# Patient Record
Sex: Male | Born: 1970 | Race: White | Hispanic: No | Marital: Married | Smoking: Former smoker
Health system: Southern US, Community
[De-identification: ages and names within clinical notes are randomized; demographics above are authoritative.]

---

## 2014-07-24 ENCOUNTER — Ambulatory Visit: Payer: Self-pay | Admitting: Otolaryngology

## 2016-07-31 IMAGING — CR DG CHEST 2V
1 series · 2 of 2 positions shown · non-contrast
Comparison: None.

CLINICAL DATA: Cough. Initial encounter.Patient had the flu 2
months ago and developed a cough which has never gone away and
worsened in past several days. Patient has no previous HX of any
heart or lung disease but has previously been a smoker.

EXAM:
CHEST  2 VIEW

[Series 1: dxr chest pa (or ap) and lateral · 0.14mm/px · 2 of 2 slices shown]
[im 1/2]
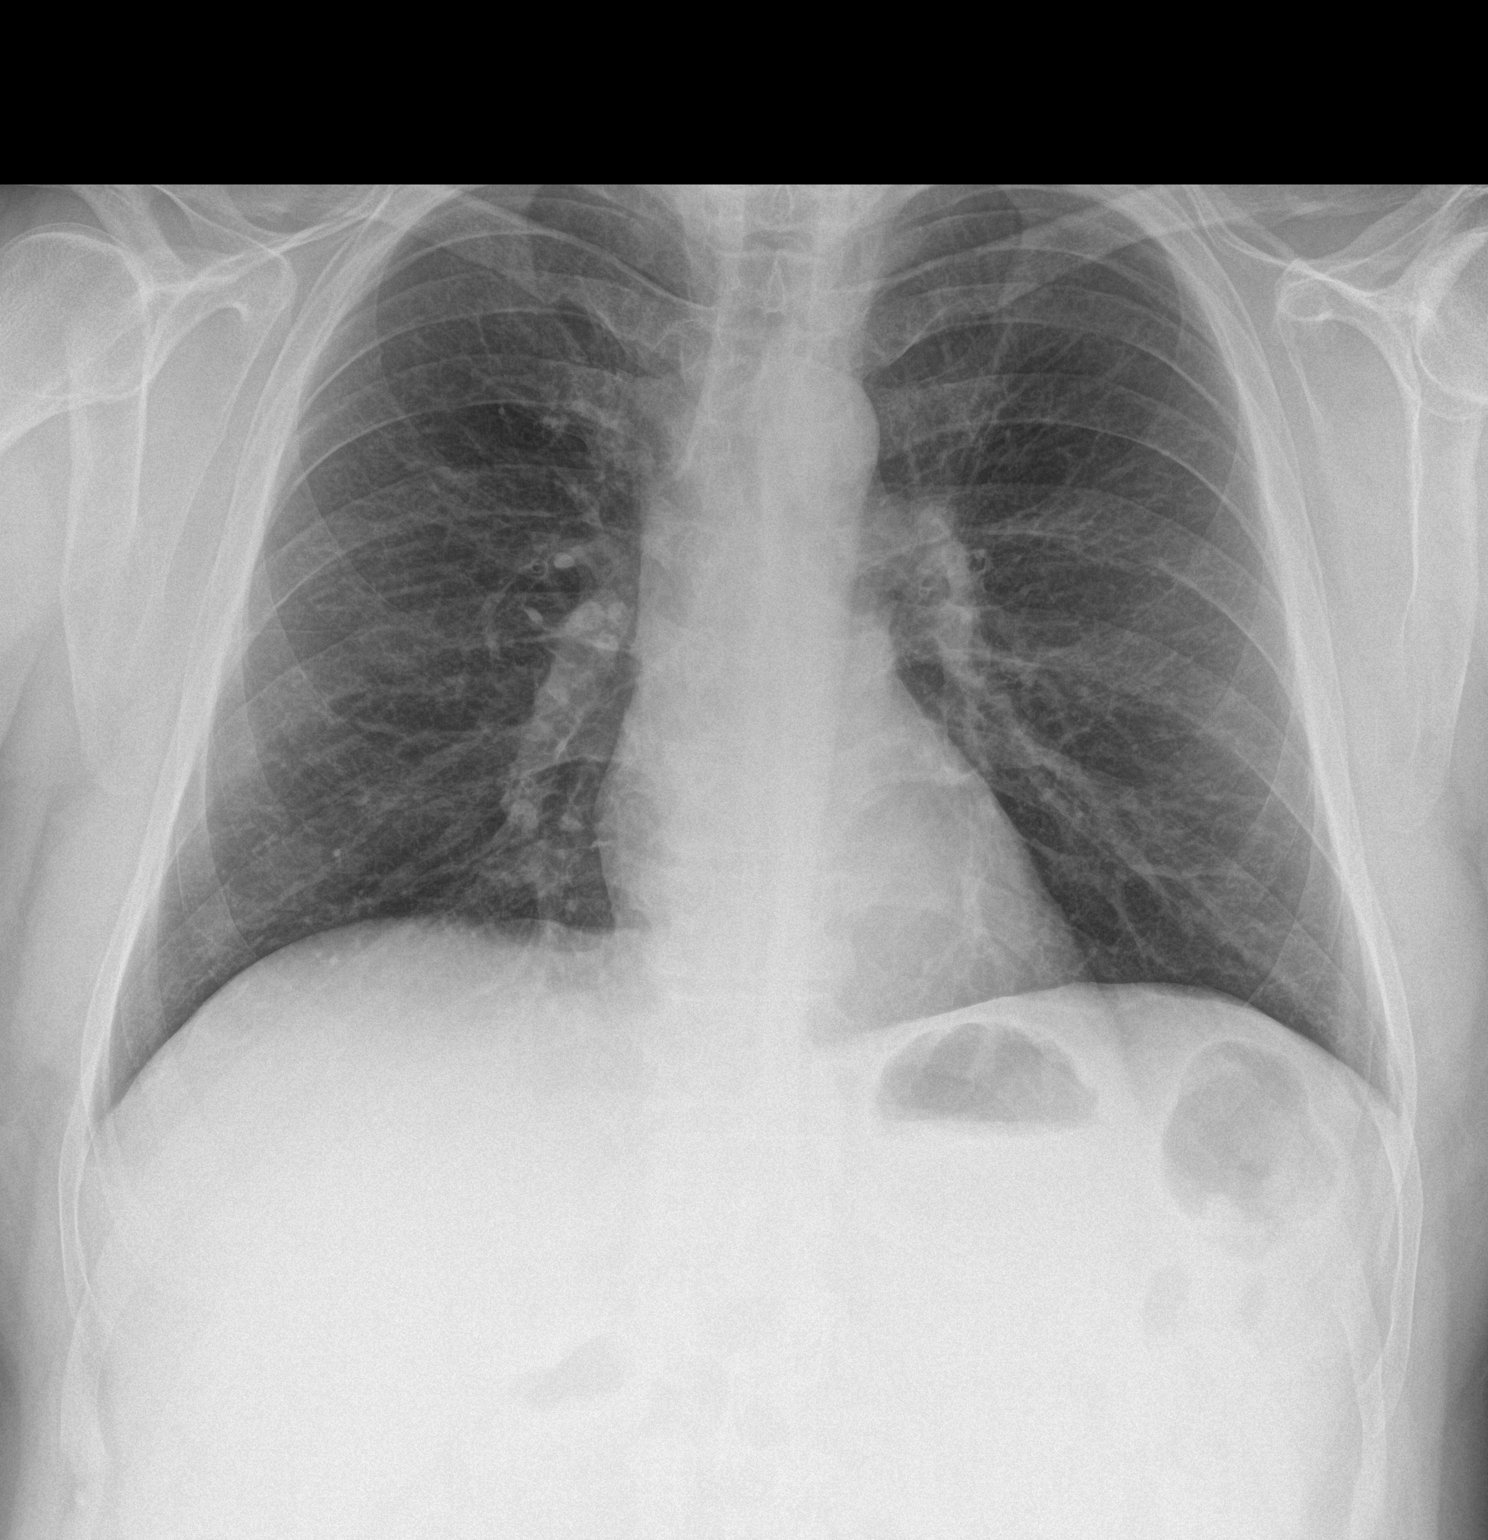
[im 2/2]
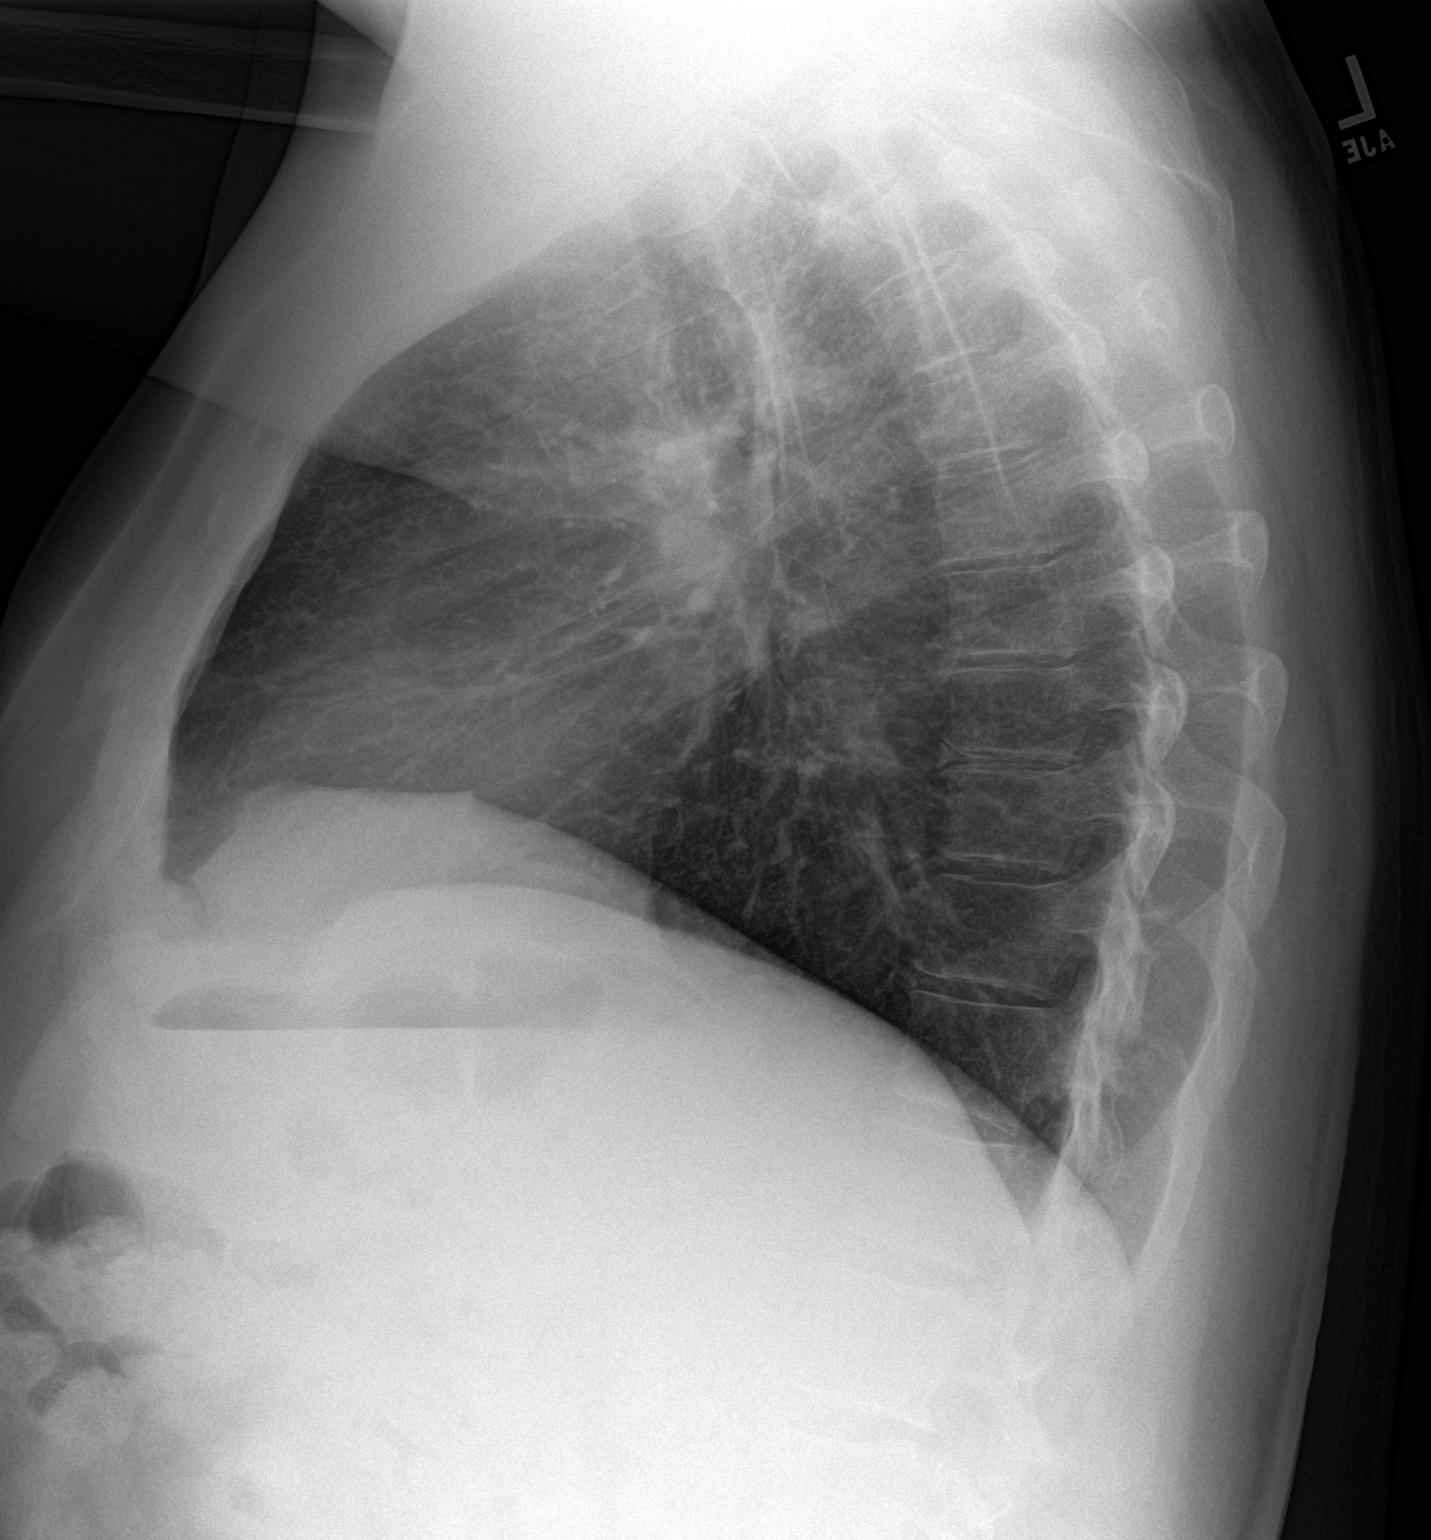

[2 of 2 positions shown; findings below may reference images not displayed]

FINDINGS: Cardiopericardial silhouette within normal limits. Mediastinal
contours normal. Trachea midline. No airspace disease or effusion.
IMPRESSION: No active cardiopulmonary disease.

## 2017-09-14 ENCOUNTER — Ambulatory Visit (INDEPENDENT_AMBULATORY_CARE_PROVIDER_SITE_OTHER): Payer: 59 | Admitting: Podiatry

## 2017-09-14 ENCOUNTER — Encounter: Payer: Self-pay | Admitting: Podiatry

## 2017-09-14 ENCOUNTER — Ambulatory Visit (INDEPENDENT_AMBULATORY_CARE_PROVIDER_SITE_OTHER): Payer: 59

## 2017-09-14 DIAGNOSIS — R600 Localized edema: Secondary | ICD-10-CM

## 2017-09-14 DIAGNOSIS — S99922A Unspecified injury of left foot, initial encounter: Secondary | ICD-10-CM

## 2017-09-14 DIAGNOSIS — S99912A Unspecified injury of left ankle, initial encounter: Secondary | ICD-10-CM

## 2017-09-14 NOTE — Progress Notes (Signed)
Subjective:   Patient ID: Ricky Flores, Ricky Flores   DOB: 47 y.o.   MRN: [id number]   HPI Patient presents with swollen left ankle secondary to ankle sprain experienced 3 days ago with history of ankle sprains and having had fracture of his right ankle secondary to the same type of injury.  Patient states that he did not feel a pop on this 1 and patient does not smoke and likes to be active   Review of Systems  All other systems reviewed and are negative.       Objective:  Physical Exam  Constitutional: He appears well-developed and well-nourished.  Cardiovascular: Intact distal pulses.  Pulmonary/Chest: Effort normal.  Musculoskeletal: Normal range of motion.  Neurological: He is alert.  Skin: Skin is warm.  Nursing note and vitals reviewed.   Neurovascular status intact muscle strength is adequate range of motion within normal limits with patient found to have discomfort in the outside of the left foot around the peroneal insertion and also in the ankle joint itself mostly in the anterior and posterior talofibular ligaments.  There is also discomfort on the fibula itself with moderate edema and negative Homans sign was noted     Assessment:  Sprained ankle left with significant nature with edema present and pain     Plan:  H&P x-rays reviewed today I applied an Unna boot Ace wrap in order to compress take the swelling out of the foot and lower leg and I dispensed air fracture walker as he is not able to be ambulatory on this.  I discussed keeping this on for 2 to 3 weeks with gradual increase in activity and I discussed the possibilities for Tri-Lock ankle brace or other treatments and also showed him different balance exercises to do during recovery  X-rays were negative for signs of fracture or diastases injury

## 2017-09-14 NOTE — Progress Notes (Signed)
   Subjective:    Patient ID: Ricky Flores, male    DOB: 01/25/1971, 47 y.o.   MRN: [id number]  HPI    Review of Systems  All other systems reviewed and are negative.      Objective:   Physical Exam        Assessment & Plan:
